# Patient Record
Sex: Female | Born: 1980 | Race: White | Hispanic: No | Marital: Married | Smoking: Former smoker
Health system: Southern US, Community
[De-identification: ages and names within clinical notes are randomized; demographics above are authoritative.]

## PROBLEM LIST (undated history)

## (undated) DIAGNOSIS — N200 Calculus of kidney: Secondary | ICD-10-CM

## (undated) DIAGNOSIS — R5383 Other fatigue: Secondary | ICD-10-CM

## (undated) DIAGNOSIS — I471 Supraventricular tachycardia, unspecified: Secondary | ICD-10-CM

## (undated) DIAGNOSIS — N6320 Unspecified lump in the left breast, unspecified quadrant: Secondary | ICD-10-CM

## (undated) DIAGNOSIS — Z8041 Family history of malignant neoplasm of ovary: Secondary | ICD-10-CM

## (undated) DIAGNOSIS — R002 Palpitations: Secondary | ICD-10-CM

## (undated) DIAGNOSIS — Z803 Family history of malignant neoplasm of breast: Secondary | ICD-10-CM

## (undated) DIAGNOSIS — N644 Mastodynia: Secondary | ICD-10-CM

## (undated) HISTORY — PX: CHOLECYSTECTOMY: SHX55

## (undated) HISTORY — DX: Palpitations: R00.2

## (undated) HISTORY — DX: Mastodynia: N64.4

## (undated) HISTORY — DX: Family history of malignant neoplasm of breast: Z80.3

## (undated) HISTORY — PX: NOSE SURGERY: SHX723

## (undated) HISTORY — DX: Family history of malignant neoplasm of ovary: Z80.41

## (undated) HISTORY — DX: Supraventricular tachycardia: I47.1

## (undated) HISTORY — DX: Supraventricular tachycardia, unspecified: I47.10

## (undated) HISTORY — PX: KNEE SURGERY: SHX244

## (undated) HISTORY — DX: Unspecified lump in the left breast, unspecified quadrant: N63.20

## (undated) HISTORY — DX: Other fatigue: R53.83

---

## 1998-05-18 ENCOUNTER — Other Ambulatory Visit: Admission: RE | Admit: 1998-05-18 | Discharge: 1998-05-18 | Payer: Self-pay | Admitting: *Deleted

## 1998-07-01 ENCOUNTER — Encounter: Payer: Self-pay | Admitting: *Deleted

## 1998-07-01 ENCOUNTER — Ambulatory Visit (HOSPITAL_COMMUNITY): Admission: RE | Admit: 1998-07-01 | Discharge: 1998-07-01 | Payer: Self-pay | Admitting: *Deleted

## 1999-07-17 ENCOUNTER — Other Ambulatory Visit: Admission: RE | Admit: 1999-07-17 | Discharge: 1999-07-17 | Payer: Self-pay | Admitting: Family Medicine

## 2000-12-20 ENCOUNTER — Other Ambulatory Visit: Admission: RE | Admit: 2000-12-20 | Discharge: 2000-12-20 | Payer: Self-pay | Admitting: Family Medicine

## 2001-08-10 ENCOUNTER — Emergency Department (HOSPITAL_COMMUNITY): Admission: EM | Admit: 2001-08-10 | Discharge: 2001-08-10 | Payer: Self-pay | Admitting: *Deleted

## 2002-01-22 ENCOUNTER — Emergency Department (HOSPITAL_COMMUNITY): Admission: EM | Admit: 2002-01-22 | Discharge: 2002-01-22 | Payer: Self-pay | Admitting: Emergency Medicine

## 2002-03-25 ENCOUNTER — Other Ambulatory Visit: Admission: RE | Admit: 2002-03-25 | Discharge: 2002-03-25 | Payer: Self-pay | Admitting: Family Medicine

## 2002-04-12 ENCOUNTER — Emergency Department (HOSPITAL_COMMUNITY): Admission: EM | Admit: 2002-04-12 | Discharge: 2002-04-12 | Payer: Self-pay

## 2002-04-13 ENCOUNTER — Inpatient Hospital Stay (HOSPITAL_COMMUNITY): Admission: EM | Admit: 2002-04-13 | Discharge: 2002-04-14 | Payer: Self-pay | Admitting: Emergency Medicine

## 2002-04-13 ENCOUNTER — Encounter: Payer: Self-pay | Admitting: Emergency Medicine

## 2002-04-14 ENCOUNTER — Encounter: Payer: Self-pay | Admitting: Urology

## 2004-01-21 ENCOUNTER — Other Ambulatory Visit: Admission: RE | Admit: 2004-01-21 | Discharge: 2004-01-21 | Payer: Self-pay | Admitting: Family Medicine

## 2004-01-31 ENCOUNTER — Encounter: Admission: RE | Admit: 2004-01-31 | Discharge: 2004-01-31 | Payer: Self-pay | Admitting: Family Medicine

## 2004-06-20 ENCOUNTER — Emergency Department (HOSPITAL_COMMUNITY): Admission: EM | Admit: 2004-06-20 | Discharge: 2004-06-20 | Payer: Self-pay | Admitting: Emergency Medicine

## 2004-06-21 ENCOUNTER — Ambulatory Visit: Payer: Self-pay | Admitting: Internal Medicine

## 2004-07-05 ENCOUNTER — Ambulatory Visit: Payer: Self-pay | Admitting: Internal Medicine

## 2004-07-10 ENCOUNTER — Ambulatory Visit: Payer: Self-pay | Admitting: Gastroenterology

## 2004-07-17 ENCOUNTER — Ambulatory Visit: Payer: Self-pay | Admitting: Gastroenterology

## 2004-12-14 ENCOUNTER — Emergency Department (HOSPITAL_COMMUNITY): Admission: EM | Admit: 2004-12-14 | Discharge: 2004-12-14 | Payer: Self-pay | Admitting: Emergency Medicine

## 2006-05-07 ENCOUNTER — Other Ambulatory Visit: Admission: RE | Admit: 2006-05-07 | Discharge: 2006-05-07 | Payer: Self-pay | Admitting: Obstetrics and Gynecology

## 2006-05-08 ENCOUNTER — Ambulatory Visit: Payer: Self-pay | Admitting: Oncology

## 2012-12-09 ENCOUNTER — Encounter (HOSPITAL_COMMUNITY): Payer: Self-pay | Admitting: *Deleted

## 2012-12-09 ENCOUNTER — Emergency Department (HOSPITAL_COMMUNITY)
Admission: EM | Admit: 2012-12-09 | Discharge: 2012-12-09 | Disposition: A | Discharge status: Home / Self Care | Payer: 59 | Source: Home / Self Care | Attending: Emergency Medicine | Admitting: Emergency Medicine

## 2012-12-09 DIAGNOSIS — J329 Chronic sinusitis, unspecified: Secondary | ICD-10-CM

## 2012-12-09 MED ORDER — DOXYCYCLINE HYCLATE 100 MG PO CAPS: 100.0000 mg | ORAL_CAPSULE | Freq: Two times a day (BID) | ORAL | Status: AC

## 2012-12-09 MED ORDER — FEXOFENADINE-PSEUDOEPHED ER 60-120 MG PO TB12: 1.0000 | ORAL_TABLET | Freq: Two times a day (BID) | ORAL | Status: AC

## 2012-12-09 NOTE — ED Provider Notes (Signed)
History     CSN: [id number]  Arrival date & time 12/09/12  1502   First MD Initiated Contact with Patient 12/09/12 1547      Chief Complaint  Patient presents with  . Sinus Problem    (Consider location/radiation/quality/duration/timing/severity/associated sxs/prior treatment) HPI Comments: Patient presents urgent care describing that for 4 days been having a constant sinus pressure ( points to both maxillary sinuses), and drainage " is green and thick uterus.".Marland KitchenMarland Kitchen Been also having postnasal drainage for several days and intermittent popping sensation in both my ears. No fevers. Haven't tried take some Sudafed but it did not help. Denies any shortness of breath wheezing fevers  Patient is a 32 y.o. female presenting with sinus complaint. The history is provided by the patient.  Sinus Problem This is a new problem. The current episode started more than 2 days ago. The problem occurs constantly. The problem has been gradually worsening. Pertinent negatives include no abdominal pain. Nothing relieves the symptoms. She has tried nothing (sudafed) for the symptoms.    History reviewed. No pertinent past medical history.  History reviewed. No pertinent past surgical history.  History reviewed. No pertinent family history.  History  Substance Use Topics  . Smoking status: Former Games developer  . Smokeless tobacco: Not on file  . Alcohol Use: Yes     Comment: occasionally    OB History   Grav Para Term Preterm Abortions TAB SAB Ect Mult Living                  Review of Systems  Constitutional: Negative for fever, chills, activity change and appetite change.  HENT: Positive for congestion, rhinorrhea, sneezing, postnasal drip and sinus pressure. Negative for ear pain, sore throat, facial swelling, mouth sores, trouble swallowing, neck pain, neck stiffness, dental problem and voice change.   Gastrointestinal: Negative for abdominal pain.  Skin: Negative for pallor, rash and wound.     Allergies  Morphine and related; Vicodin; and Oxycodone  Home Medications   Current Outpatient Rx  Name  Route  Sig  Dispense  Refill  . doxycycline (VIBRAMYCIN) 100 MG capsule   Oral   Take 1 capsule (100 mg total) by mouth 2 (two) times daily.   20 capsule   0   . fexofenadine-pseudoephedrine (ALLEGRA-D) 60-120 MG per tablet   Oral   Take 1 tablet by mouth every 12 (twelve) hours.   30 tablet   0     BP 119/85  Pulse 87  Temp(Src) 97.9 F (36.6 C) (Oral)  Resp 12  SpO2 100%  LMP 12/04/2012  Physical Exam  Constitutional: She is oriented to person, place, and time. Vital signs are normal. She appears well-developed and well-nourished.  Non-toxic appearance. She does not have a sickly appearance. She does not appear ill. No distress.  HENT:  Head: Normocephalic.  Right Ear: Tympanic membrane normal.  Left Ear: Tympanic membrane normal.  Mouth/Throat: Uvula is midline. Posterior oropharyngeal erythema present. No oropharyngeal exudate, posterior oropharyngeal edema or tonsillar abscesses.  Eyes: Conjunctivae are normal. Pupils are equal, round, and reactive to light. No scleral icterus.  Neck: Neck supple. No thyromegaly present.  Pulmonary/Chest: Effort normal and breath sounds normal. She has no decreased breath sounds. She has no wheezes. She has no rhonchi. She has no rales.  Neurological: She is alert and oriented to person, place, and time.  Skin: No rash noted. No erythema.    ED Course  Procedures (including critical care time)  Labs Reviewed -  No data to display No results found.   1. Sinusitis       MDM  Maxillary sinusitis. Patient was prescribed Allegra-D and doxycycline.        Jimmie Molly, MD 12/09/12 [phone]

## 2012-12-09 NOTE — ED Notes (Signed)
Patient complains of sinus pressure and pain with congestion and green drainage starting Sunday. Popping sensation in ear and tenderness under eyes. Denies fever/chills, nausea/vomiting.

## 2013-10-07 ENCOUNTER — Encounter (HOSPITAL_COMMUNITY): Payer: Self-pay | Admitting: Emergency Medicine

## 2013-10-07 ENCOUNTER — Emergency Department (HOSPITAL_COMMUNITY): Payer: Worker's Compensation

## 2013-10-07 ENCOUNTER — Emergency Department (HOSPITAL_COMMUNITY)
Admission: EM | Admit: 2013-10-07 | Discharge: 2013-10-07 | Disposition: A | Discharge status: Home / Self Care | Payer: Worker's Compensation | Attending: Emergency Medicine | Admitting: Emergency Medicine

## 2013-10-07 DIAGNOSIS — Z87891 Personal history of nicotine dependence: Secondary | ICD-10-CM | POA: Diagnosis not present

## 2013-10-07 DIAGNOSIS — R05 Cough: Secondary | ICD-10-CM | POA: Diagnosis not present

## 2013-10-07 DIAGNOSIS — J705 Respiratory conditions due to smoke inhalation: Secondary | ICD-10-CM | POA: Insufficient documentation

## 2013-10-07 DIAGNOSIS — R059 Cough, unspecified: Secondary | ICD-10-CM | POA: Diagnosis not present

## 2013-10-07 DIAGNOSIS — Z87442 Personal history of urinary calculi: Secondary | ICD-10-CM | POA: Diagnosis not present

## 2013-10-07 DIAGNOSIS — T59811A Toxic effect of smoke, accidental (unintentional), initial encounter: Secondary | ICD-10-CM

## 2013-10-07 DIAGNOSIS — Z79899 Other long term (current) drug therapy: Secondary | ICD-10-CM | POA: Insufficient documentation

## 2013-10-07 HISTORY — DX: Calculus of kidney: N20.0

## 2013-10-07 MED ORDER — ACETAMINOPHEN 500 MG PO TABS
1000.0000 mg | ORAL_TABLET | Freq: Once | ORAL | Status: AC
  Administered 2013-10-07: 1000 mg via ORAL
  Filled 2013-10-07: qty 2

## 2013-10-07 MED ORDER — IBUPROFEN 400 MG PO TABS
600.0000 mg | ORAL_TABLET | Freq: Once | ORAL | Status: AC
  Administered 2013-10-07: 600 mg via ORAL
  Filled 2013-10-07 (×2): qty 1
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
## 2013-10-07 NOTE — ED Notes (Signed)
Visitors at the bedside.

## 2013-10-07 NOTE — ED Notes (Signed)
Patient is alert and orientedx4.  Patient was explained discharge instructions and they understood them with no questions.  The patient's father, Nicanor Bakeddie Hampton, is taking the patient home.

## 2013-10-07 NOTE — ED Notes (Addendum)
Pt reports she does feel a little lightheaded. Speaking in full sentences, no singed nasal hairs seen, no swelling/black color to mouth or throat seen.
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
## 2014-11-09 ENCOUNTER — Other Ambulatory Visit: Payer: Self-pay | Admitting: Nurse Practitioner

## 2014-11-09 ENCOUNTER — Ambulatory Visit
Admission: RE | Admit: 2014-11-09 | Discharge: 2014-11-09 | Disposition: A | Discharge status: Home / Self Care | Payer: Worker's Compensation | Source: Ambulatory Visit | Attending: Nurse Practitioner | Admitting: Nurse Practitioner

## 2014-11-09 DIAGNOSIS — M79645 Pain in left finger(s): Secondary | ICD-10-CM
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
## 2018-10-21 ENCOUNTER — Institutional Professional Consult (permissible substitution): Payer: Self-pay | Admitting: Internal Medicine

## 2018-10-28 ENCOUNTER — Institutional Professional Consult (permissible substitution): Payer: Self-pay | Admitting: Internal Medicine

## 2018-10-30 ENCOUNTER — Telehealth: Payer: Self-pay

## 2018-10-30 NOTE — Telephone Encounter (Signed)
Late Entry: Pt is agreeable to postponing her upcoming appt. She is being followed by her cardiologist Dr Boneta Lucks and can refer to him for additional needs. She denies SOB, CP, edema, recent dizziness or syncope. She understands we will call her in the future to reschedule.

## 2018-11-10 ENCOUNTER — Telehealth: Payer: Self-pay

## 2018-11-10 NOTE — Telephone Encounter (Signed)
I called and left patient a message offering her a virtual visit with Dr. Graciela Husbands on 11/13/18. Advised patient to call back if she is or isn't interested in virtual visits. Patient will need to be set up with mychart if she wants to set up virtual visit. Appointment times on 11/13/18 are 1:30, 3, 3:30, and 4.

## 2018-11-12 NOTE — Telephone Encounter (Signed)
  Patient is calling because she would like someone to call her to set up virtual visit for tomorrow

## 2018-11-13 NOTE — Telephone Encounter (Signed)
I called and left patient a detailed message letting her know that we will be calling in a couple of weeks to set up virtual visit with Dr. Graciela Husbands.

## 2018-11-13 NOTE — Telephone Encounter (Signed)
If patient calls back we will need to wait for her to finish her monitor and get the results back before we schedule her.

## 2018-11-13 NOTE — Telephone Encounter (Signed)
There has been a cancellation on Dr. Odessa Fleming schedule today to 2:30. Does patient want this appointment? If so can se Astronomer or do we need to text the link for video visit? I have left patient a message to call back and let me know if she wants this appointment.

## 2021-01-14 ENCOUNTER — Emergency Department (HOSPITAL_COMMUNITY)
Admission: EM | Admit: 2021-01-14 | Discharge: 2021-01-14 | Disposition: A | Discharge status: Home / Self Care | Payer: 59 | Attending: Emergency Medicine | Admitting: Emergency Medicine

## 2021-01-14 ENCOUNTER — Encounter (HOSPITAL_COMMUNITY): Payer: Self-pay

## 2021-01-14 ENCOUNTER — Other Ambulatory Visit: Payer: Self-pay

## 2021-01-14 ENCOUNTER — Emergency Department (HOSPITAL_COMMUNITY): Payer: 59

## 2021-01-14 DIAGNOSIS — Z87891 Personal history of nicotine dependence: Secondary | ICD-10-CM | POA: Diagnosis not present

## 2021-01-14 DIAGNOSIS — I493 Ventricular premature depolarization: Secondary | ICD-10-CM | POA: Diagnosis not present

## 2021-01-14 DIAGNOSIS — R112 Nausea with vomiting, unspecified: Secondary | ICD-10-CM | POA: Diagnosis not present

## 2021-01-14 DIAGNOSIS — R002 Palpitations: Secondary | ICD-10-CM | POA: Diagnosis present

## 2021-01-14 LAB — I-STAT BETA HCG BLOOD, ED (MC, WL, AP ONLY): I-stat hCG, quantitative: <5 m[IU]/mL (ref <5)

## 2021-01-14 LAB — BASIC METABOLIC PANEL
Anion gap: 7 (ref 5–15)
BUN: 11 mg/dL (ref 6–20)
CO2: 28 mmol/L (ref 22–32)
Calcium: 9.1 mg/dL (ref 8.9–10.3)
Chloride: 102 mmol/L (ref 98–111)
Creatinine, Ser: 0.73 mg/dL (ref 0.44–1.00)
GFR, Estimated: >60 mL/min (ref >60)
Glucose, Bld: 113 mg/dL — ABNORMAL HIGH (ref 70–99)
Potassium: 4.8 mmol/L (ref 3.5–5.1)
Sodium: 137 mmol/L (ref 135–145)

## 2021-01-14 LAB — CBC
HCT: 38 % (ref 36.0–46.0)
Hemoglobin: 12.6 g/dL (ref 12.0–15.0)
MCH: 31.1 pg (ref 26.0–34.0)
MCHC: 33.2 g/dL (ref 30.0–36.0)
MCV: 93.8 fL (ref 80.0–100.0)
Platelets: 222 10*3/uL (ref 150–400)
RBC: 4.05 MIL/uL (ref 3.87–5.11)
RDW: 13 % (ref 11.5–15.5)
WBC: 5.5 10*3/uL (ref 4.0–10.5)
nRBC: 0 % (ref 0.0–0.2)

## 2021-01-14 LAB — TROPONIN I (HIGH SENSITIVITY)
Troponin I (High Sensitivity): <2 ng/L (ref <18)
Troponin I (High Sensitivity): <2 ng/L (ref <18)

## 2021-01-14 LAB — MAGNESIUM: Magnesium: 2 mg/dL (ref 1.7–2.4)

## 2021-01-14 LAB — TSH: TSH: 0.875 u[IU]/mL (ref 0.350–4.500)

## 2021-01-14 LAB — T4, FREE: Free T4: 0.76 ng/dL (ref 0.61–1.12)

## 2021-01-14 MED ORDER — ACETAMINOPHEN 500 MG PO TABS
1000.0000 mg | ORAL_TABLET | Freq: Once | ORAL | Status: AC
  Administered 2021-01-14: 1000 mg via ORAL
  Filled 2021-01-14: qty 2

## 2021-01-14 NOTE — Discharge Instructions (Addendum)
You were seen in the emergency department for palpitations  Lab work that we obtained here is overall reassuring and normal  EKG done initially showed a normal sinus rhythm with some preventricular contractions (PVCs).  PVCs are very common and often do not cause any symptoms unless they happen back to back, multiple at a time.  It is possible that you are having intermittent PVCs that are causing you to be lightheaded and have palpitations.  Typically PVCs are not life-threatening.  It is also possible that you have had some runs of SVT.  At this time, you can follow-up with cardiology for further discussion of your palpitations. They may repeat a holter monitor or start you on a beta blocker  Stay hydrated and drink at least 2 L of water a day.  Avoid any caffeine.  Make sure you are getting adequate sleep.  Return to the ED for chest pain or shortness of breath with exertion, recurrent or severe palpitations with dizziness, passing out

## 2021-01-14 NOTE — ED Provider Notes (Signed)
[facility] EMERGENCY DEPARTMENT Provider Note   CSN: [id number] Arrival date & time: 01/14/21  1047     History No chief complaint on file.   DALYAH PLA is a 40 y.o. female with history of SVT and palpitations presents to ED for evaluation of sudden onset palpitations, nausea, vomiting, diaphoresis prior to arrival while she was sitting doing computer work. Lasting 1-2 minutes.  Reports history of the same for years. She went to see a cardiologist in [place] 2-3 years ago for this. She wore a holter monitor and was told they captured "atrial fibrillation" only once but told her nothing needed to be done about it. Cardiologist note reviewed and he documented SVT. Patient states that's not what she was told. Has had intermittent palpitations since but more frequent in the last few weeks. Happening almost daily, lasting 1-2 min.  They happen at rest or with exertion.  They take her breath away when they are happening but denies otherwise exertional chest pain or shortness of breath. No syncope. Drinks 1 cup of coffee daily. No energy drinks. Stays hydrated daily.  Denies recreational drug use. Rare, occasional alcohol use.  No thyroid problems in the past.  No recent medication changes.  No history of blood clots. No new leg swelling, calf pain.   HPI     Past Medical History:  Diagnosis Date  . Breast pain, left   . Family history of breast cancer   . Family history of ovarian cancer   . Fatigue   . Kidney stone   . Left breast mass   . Palpitations   . SVT (supraventricular tachycardia) (HCC)     There are no problems to display for this patient.   Past Surgical History:  Procedure Laterality Date  . CHOLECYSTECTOMY    . KNEE SURGERY Right   . NOSE SURGERY       OB History   No obstetric history on file.     No family history on file.  Social History   Tobacco Use  . Smoking status: Former Games developer  . Smokeless tobacco: Never Used  Substance Use  Topics  . Alcohol use: Yes    Comment: occasionally  . Drug use: No    Home Medications Prior to Admission medications   Medication Sig Start Date End Date Taking? Authorizing Provider  FIBER SELECT GUMMIES CHEW Chew 2 tablets by mouth daily.    [provider]  folic acid (FOLVITE) 400 MCG tablet Take 400 mcg by mouth daily.    [provider]  ibuprofen (ADVIL,MOTRIN) 100 MG tablet Take 100 mg by mouth every 6 (six) hours as needed for fever.    [provider]  Multiple Vitamin (MULTIVITAMIN) tablet Take 1 tablet by mouth daily.    [provider]  valACYclovir (VALTREX) 1000 MG tablet Take 1,000 mg by mouth 2 (two) times daily.    [provider]  zinc gluconate 50 MG tablet Take 400 mg by mouth daily.    [provider]    Allergies    Morphine and related, Vicodin [hydrocodone-acetaminophen], and Oxycodone  Review of Systems   Review of Systems  Cardiovascular: Positive for palpitations.  Gastrointestinal: Positive for nausea and vomiting.  Neurological: Positive for light-headedness.  All other systems reviewed and are negative.   Physical Exam Updated Vital Signs BP 125/77   Pulse 65   Resp 14   SpO2 100%   Physical Exam Constitutional:  Appearance: She is well-developed.     Comments: NAD. Non toxic.   HENT:     Head: Normocephalic and atraumatic.     Nose: Nose normal.  Eyes:     General: Lids are normal.     Conjunctiva/sclera: Conjunctivae normal.  Neck:     Trachea: Trachea normal.  Cardiovascular:     Rate and Rhythm: Normal rate and regular rhythm.     Pulses:          Radial pulses are 1+ on the right side and 1+ on the left side.       Dorsalis pedis pulses are 1+ on the right side and 1+ on the left side.     Heart sounds: Normal heart sounds, S1 normal and S2 normal.     Comments: No murmurs. No LE edema or calf tenderness.  Pulmonary:     Effort: Pulmonary effort is normal.     Breath  sounds: Normal breath sounds.  Abdominal:     General: Bowel sounds are normal.     Palpations: Abdomen is soft.     Tenderness: There is no abdominal tenderness.  Musculoskeletal:     Cervical back: Normal range of motion.  Skin:    General: Skin is warm and dry.     Capillary Refill: Capillary refill takes less than 2 seconds.  Neurological:     Mental Status: She is alert.     GCS: GCS eye subscore is 4. GCS verbal subscore is 5. GCS motor subscore is 6.  Psychiatric:        Speech: Speech normal.        Behavior: Behavior normal.        Thought Content: Thought content normal.     ED Results / Procedures / Treatments   Labs (all labs ordered are listed, but only abnormal results are displayed) Labs Reviewed  BASIC METABOLIC PANEL - Abnormal; Notable for the following components:      Result Value   Glucose, Bld 113 (*)    All other components within normal limits  CBC  TSH  MAGNESIUM  T4, FREE  I-STAT BETA HCG BLOOD, ED ([facility], [facility], [facility] ONLY)  TROPONIN I (HIGH SENSITIVITY)  TROPONIN I (HIGH SENSITIVITY)    EKG EKG Interpretation  Date/Time:  Saturday January 14 2021 10:52:42 EDT Ventricular Rate:  82 PR Interval:  150 QRS Duration: 72 QT Interval:  370 QTC Calculation: 432 R Axis:   10 Text Interpretation: Sinus rhythm with Premature ventricular complexes or Fusion complexes Otherwise normal ECG Confirmed by Kristine Royal [phone]) on 01/14/2021 12:13:15 PM   Radiology DG Chest 2 View  Result Date: 01/14/2021 CLINICAL DATA:  Chest pain and diaphoresis EXAM: CHEST - 2 VIEW COMPARISON:  October 07, 2013 FINDINGS: Lungs are clear. Heart size and pulmonary vascularity are normal. No adenopathy. No pneumothorax. No bone lesions. IMPRESSION: Lungs clear.  Cardiac silhouette normal. Electronically Signed   By: Bretta Bang III M.D.   On: 01/14/2021 11:46    Procedures Procedures   Medications Ordered in ED Medications  acetaminophen (TYLENOL) tablet 1,000 mg (1,000  mg Oral Given 01/14/21 1334)    ED Course  I have reviewed the triage vital signs and the nursing notes.  Pertinent labs & imaging results that were available during my care of the patient were reviewed by me and considered in my medical decision making (see chart for details).  Clinical Course as of 01/14/21 1501  Sat Jan 14, 2021  1250  EKG 12-Lead EKG NSR with infrequent PVCs [CG]  1250 Hemoglobin: 12.6 [CG]  1250 Potassium: 4.8 [CG]  1250 Troponin I (High Sensitivity): <2 [CG]    Clinical Course User Index [CG] Liberty Handy, PA-C   MDM Rules/Calculators/A&P                           40 y.o. yo female presents to the ED for evaluation of intermittent palpitations for several years.  Have been more frequent and happening almost every other day.  The day she had sudden onset palpitations with nausea, vomiting, diaphoresis, left arm pain.  Patient shows me her apple watch where she had picked up a "abnormal" rhythm when she was having palpitations.  On my review this looks like PACs, she had 8 back-to-back.  Additional information obtained from chart, nursing and triage notes review  Chart review reveals -seen by cardiology for palpitations recently at [facility].  Per cardiology note patient had SVT picked up on Holter monitor.  No interventions were done at that time.  Ordered lab, imaging were personally reviewed and interpreted  Labs reveal -normal electrolytes including magnesium.  TSH normal.  Normal hemoglobin.  Troponin x2 undetectable.  Imaging reveals -chest x-ray is unremarkable.  EKG with PVCs.  Medicines ordered -none  ED course & MDM 1500: Overall work-up is reassuring.  It is possible patient had an episode of SVT.  She did not have any cardiac events on monitor today.  Her work-up is benign.  Her heart score is less than 3.  I considered ACS unlikely given her symptoms, duration of her symptoms for several years.  Discussed option of starting a beta-blocker  but patient feels comfortable deferring this until she sees cardiology again.  Will give referral to [facility].  Stressed importance of staying hydrated, avoiding caffeine, adequate sleep.  Strict return precautions given.  Patient is comfortable this plan.  Portions of this note were generated with Scientist, clinical (histocompatibility and immunogenetics). Dictation errors may occur despite best attempts at proofreading    Final Clinical Impression(s) / ED Diagnoses Final diagnoses:  Palpitations  PVC's (premature ventricular contractions)    Rx / DC Orders ED Discharge Orders    None       Liberty Handy, PA-C 01/14/21 1501    Wynetta Fines, MD 01/15/21 [phone]

## 2021-01-14 NOTE — ED Triage Notes (Signed)
Patient was at work and had palpitations with nausea, headache and diaphoresis. On arrival alert and oriented, no further palpitations. Complains of headache only.

## 2022-05-02 ENCOUNTER — Other Ambulatory Visit (HOSPITAL_BASED_OUTPATIENT_CLINIC_OR_DEPARTMENT_OTHER): Payer: Self-pay

## 2022-05-02 MED ORDER — SAXENDA 18 MG/3ML ~~LOC~~ SOPN
PEN_INJECTOR | SUBCUTANEOUS | 0 refills | Status: AC
  Filled 2022-05-02: qty 15, 30d supply, fill #0
  Filled 2022-06-05: qty 15, 28d supply, fill #0

## 2022-05-02 MED ORDER — PEN NEEDLES 32G X 4 MM MISC
0 refills | Status: AC
  Filled 2022-05-02: qty 100, 31d supply, fill #0
  Filled 2022-05-03 – 2022-06-19 (×2): qty 100, 30d supply, fill #0

## 2022-05-03 ENCOUNTER — Other Ambulatory Visit (HOSPITAL_BASED_OUTPATIENT_CLINIC_OR_DEPARTMENT_OTHER): Payer: Self-pay

## 2022-05-13 ENCOUNTER — Encounter (HOSPITAL_BASED_OUTPATIENT_CLINIC_OR_DEPARTMENT_OTHER): Payer: Self-pay | Admitting: Pharmacist

## 2022-05-13 ENCOUNTER — Other Ambulatory Visit (HOSPITAL_BASED_OUTPATIENT_CLINIC_OR_DEPARTMENT_OTHER): Payer: Self-pay

## 2022-05-14 ENCOUNTER — Other Ambulatory Visit (HOSPITAL_BASED_OUTPATIENT_CLINIC_OR_DEPARTMENT_OTHER): Payer: Self-pay

## 2022-05-26 ENCOUNTER — Other Ambulatory Visit (HOSPITAL_BASED_OUTPATIENT_CLINIC_OR_DEPARTMENT_OTHER): Payer: Self-pay

## 2022-06-05 ENCOUNTER — Other Ambulatory Visit (HOSPITAL_BASED_OUTPATIENT_CLINIC_OR_DEPARTMENT_OTHER): Payer: Self-pay

## 2022-06-06 ENCOUNTER — Other Ambulatory Visit (HOSPITAL_BASED_OUTPATIENT_CLINIC_OR_DEPARTMENT_OTHER): Payer: Self-pay

## 2022-06-06 MED ORDER — PEN NEEDLES 32G X 4 MM MISC
0 refills | Status: AC
  Filled 2022-06-06: qty 100, 90d supply, fill #0

## 2022-06-06 MED ORDER — SAXENDA 18 MG/3ML ~~LOC~~ SOPN
PEN_INJECTOR | SUBCUTANEOUS | 0 refills | Status: AC
  Filled 2022-06-06 – 2022-06-19 (×2): qty 15, 30d supply, fill #0

## 2022-06-19 ENCOUNTER — Other Ambulatory Visit (HOSPITAL_BASED_OUTPATIENT_CLINIC_OR_DEPARTMENT_OTHER): Payer: Self-pay

## 2022-07-10 ENCOUNTER — Other Ambulatory Visit (HOSPITAL_COMMUNITY): Payer: Self-pay

## 2022-07-10 MED ORDER — SEMAGLUTIDE-WEIGHT MANAGEMENT 1 MG/0.5ML ~~LOC~~ SOAJ
1.0000 mg | SUBCUTANEOUS | 1 refills | Status: AC
  Filled 2022-07-10: qty 2, 28d supply, fill #0

## 2022-08-14 ENCOUNTER — Other Ambulatory Visit (HOSPITAL_COMMUNITY): Payer: Self-pay

## 2022-08-14 MED ORDER — WEGOVY 1.7 MG/0.75ML ~~LOC~~ SOAJ
1.7000 mg | SUBCUTANEOUS | 0 refills | Status: DC
  Filled 2022-08-14 – 2022-08-29 (×2): qty 3, 28d supply, fill #0

## 2022-08-29 ENCOUNTER — Other Ambulatory Visit (HOSPITAL_BASED_OUTPATIENT_CLINIC_OR_DEPARTMENT_OTHER): Payer: Self-pay

## 2022-08-29 ENCOUNTER — Other Ambulatory Visit (HOSPITAL_COMMUNITY): Payer: Self-pay

## 2022-08-30 ENCOUNTER — Other Ambulatory Visit (HOSPITAL_COMMUNITY): Payer: Self-pay

## 2022-09-01 IMAGING — CR DG CHEST 2V
2 series · 2 of 2 positions shown · non-contrast
Comparison: October 07, 2013

CLINICAL DATA: Chest pain and diaphoresis

EXAM:
CHEST - 2 VIEW

[chest pa]
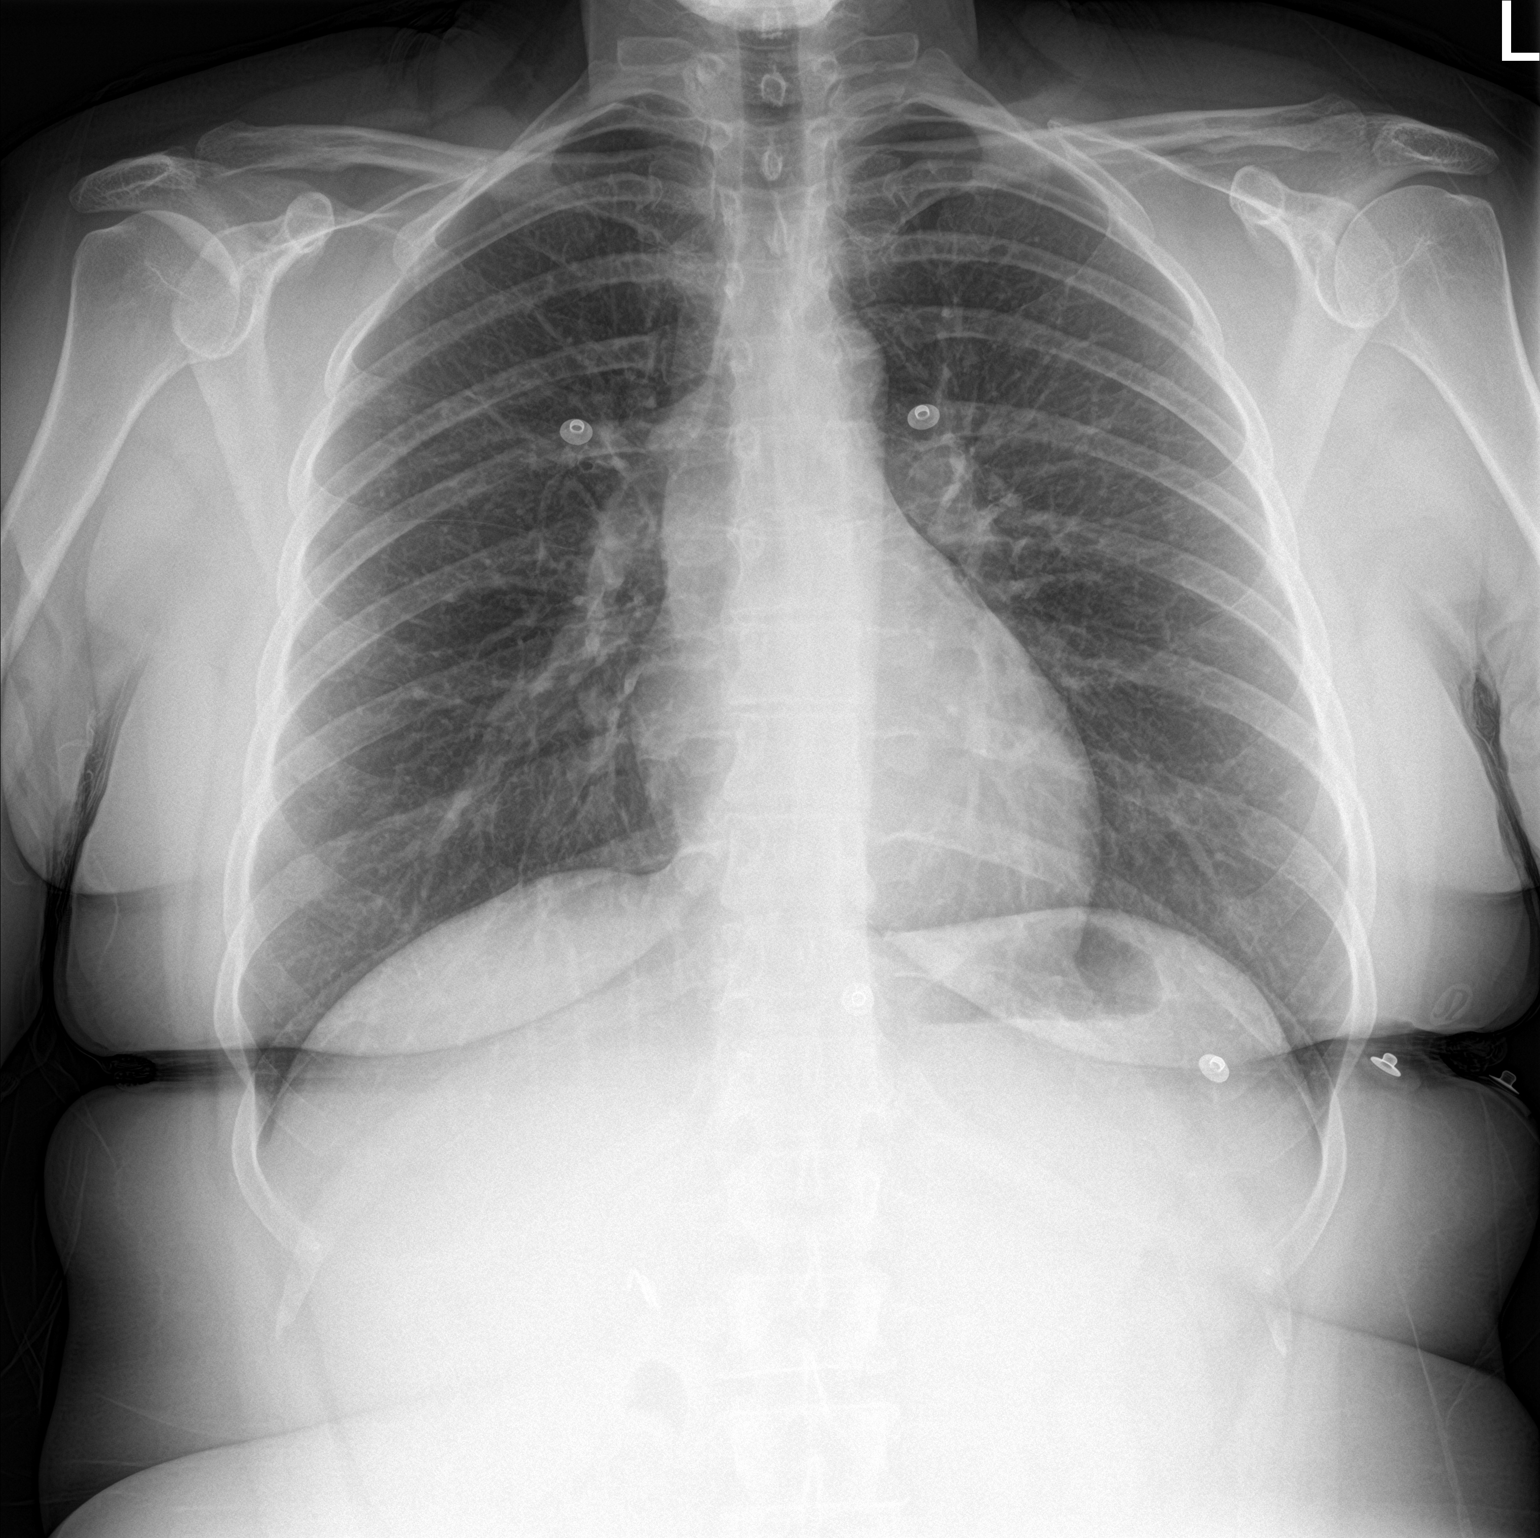

[chest lat]
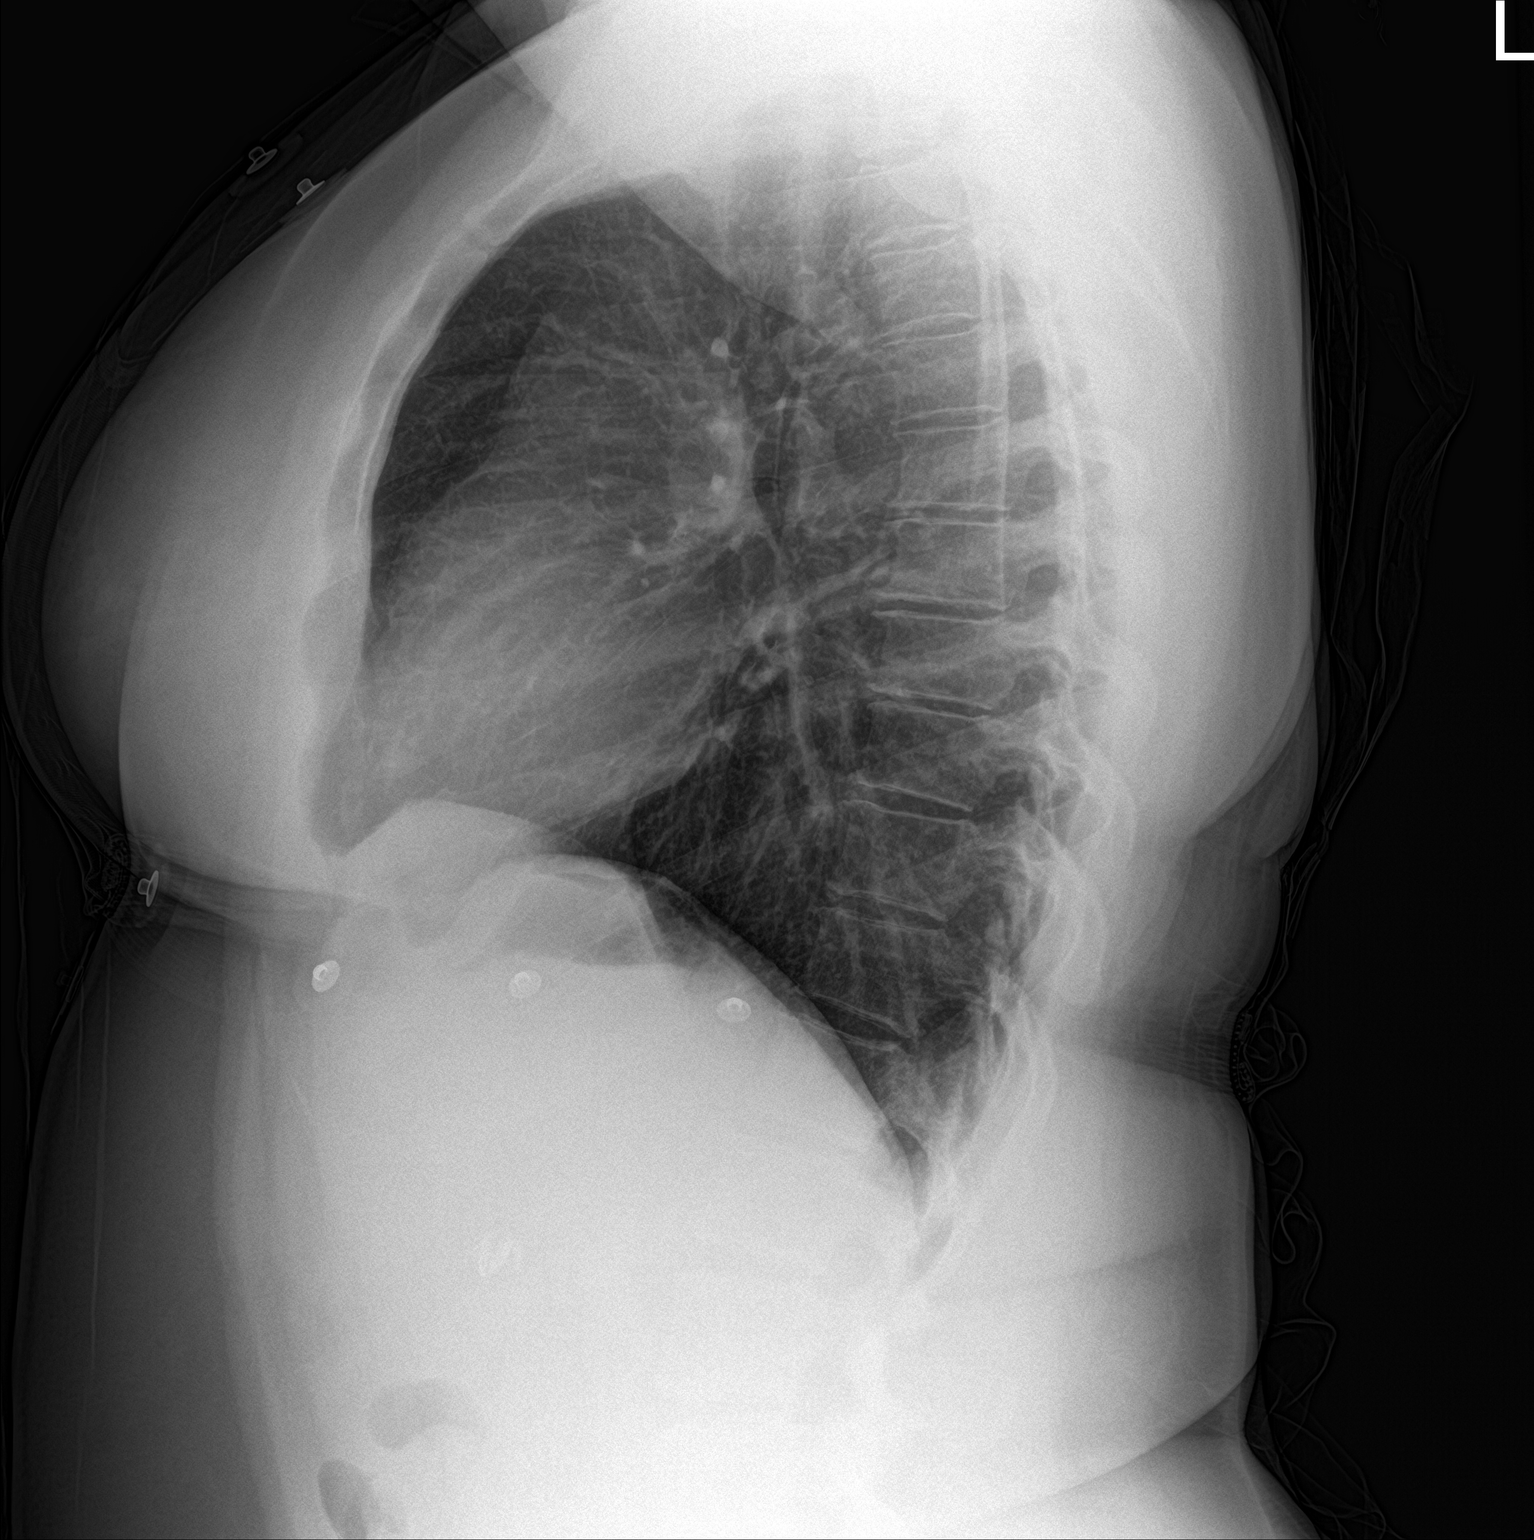

[2 of 2 positions shown; findings below may reference images not displayed]

FINDINGS: Lungs are clear. Heart size and pulmonary vascularity are normal. No
adenopathy. No pneumothorax. No bone lesions.
IMPRESSION: Lungs clear.  Cardiac silhouette normal.

## 2022-09-03 ENCOUNTER — Other Ambulatory Visit (HOSPITAL_COMMUNITY): Payer: Self-pay

## 2022-09-03 MED ORDER — WEGOVY 2.4 MG/0.75ML ~~LOC~~ SOAJ
SUBCUTANEOUS | 1 refills | Status: AC
  Filled 2022-09-03: qty 3, 28d supply, fill #0
  Filled 2022-11-26: qty 3, 28d supply, fill #1

## 2022-10-29 ENCOUNTER — Other Ambulatory Visit (HOSPITAL_COMMUNITY): Payer: Self-pay

## 2022-10-29 MED ORDER — WEGOVY 2.4 MG/0.75ML ~~LOC~~ SOAJ
2.4000 mg | SUBCUTANEOUS | 2 refills | Status: DC
  Filled 2022-10-29: qty 3, 28d supply, fill #0
  Filled 2022-11-26 – 2022-12-04 (×3): qty 3, 28d supply, fill #1

## 2022-11-01 ENCOUNTER — Other Ambulatory Visit (HOSPITAL_COMMUNITY): Payer: Self-pay

## 2022-11-29 ENCOUNTER — Other Ambulatory Visit (HOSPITAL_COMMUNITY): Payer: Self-pay

## 2022-12-04 ENCOUNTER — Other Ambulatory Visit (HOSPITAL_COMMUNITY): Payer: Self-pay

## 2023-08-12 ENCOUNTER — Other Ambulatory Visit (HOSPITAL_COMMUNITY): Payer: Self-pay

## 2023-08-12 MED ORDER — WEGOVY 1 MG/0.5ML ~~LOC~~ SOAJ: SUBCUTANEOUS | 0 refills | Status: AC

## 2023-08-12 MED ORDER — WEGOVY 0.5 MG/0.5ML ~~LOC~~ SOAJ
SUBCUTANEOUS | 0 refills | Status: AC
  Filled 2023-08-12: qty 2, 28d supply, fill #0

## 2023-08-15 ENCOUNTER — Other Ambulatory Visit (HOSPITAL_COMMUNITY): Payer: Self-pay

## 2024-09-10 ENCOUNTER — Other Ambulatory Visit (HOSPITAL_BASED_OUTPATIENT_CLINIC_OR_DEPARTMENT_OTHER): Payer: Self-pay | Admitting: Family Medicine

## 2024-09-10 DIAGNOSIS — Z8249 Family history of ischemic heart disease and other diseases of the circulatory system: Secondary | ICD-10-CM

## 2024-10-15 ENCOUNTER — Other Ambulatory Visit (HOSPITAL_BASED_OUTPATIENT_CLINIC_OR_DEPARTMENT_OTHER)
